# Patient Record
Sex: Female | Born: 1955 | Race: Black or African American | Hispanic: No | Marital: Single | Smoking: Never smoker
Health system: Southern US, Community
[De-identification: ages and names within clinical notes are randomized; demographics above are authoritative.]

## PROBLEM LIST (undated history)

## (undated) DIAGNOSIS — F32A Depression, unspecified: Secondary | ICD-10-CM

## (undated) DIAGNOSIS — R011 Cardiac murmur, unspecified: Secondary | ICD-10-CM

## (undated) DIAGNOSIS — F329 Major depressive disorder, single episode, unspecified: Secondary | ICD-10-CM

## (undated) DIAGNOSIS — G459 Transient cerebral ischemic attack, unspecified: Secondary | ICD-10-CM

## (undated) HISTORY — PX: TUBAL LIGATION: SHX77

## (undated) HISTORY — PX: CHOLECYSTECTOMY: SHX55

---

## 2016-09-26 ENCOUNTER — Emergency Department (HOSPITAL_COMMUNITY): Payer: Medicaid Other

## 2016-09-26 ENCOUNTER — Emergency Department (HOSPITAL_COMMUNITY)
Admission: EM | Admit: 2016-09-26 | Discharge: 2016-09-26 | Disposition: A | Discharge status: Home / Self Care | Payer: Medicaid Other | Attending: Emergency Medicine | Admitting: Emergency Medicine

## 2016-09-26 ENCOUNTER — Encounter (HOSPITAL_COMMUNITY): Payer: Self-pay | Admitting: Emergency Medicine

## 2016-09-26 DIAGNOSIS — Y929 Unspecified place or not applicable: Secondary | ICD-10-CM | POA: Insufficient documentation

## 2016-09-26 DIAGNOSIS — Y999 Unspecified external cause status: Secondary | ICD-10-CM | POA: Insufficient documentation

## 2016-09-26 DIAGNOSIS — W0110XA Fall on same level from slipping, tripping and stumbling with subsequent striking against unspecified object, initial encounter: Secondary | ICD-10-CM | POA: Diagnosis not present

## 2016-09-26 DIAGNOSIS — Y939 Activity, unspecified: Secondary | ICD-10-CM | POA: Insufficient documentation

## 2016-09-26 DIAGNOSIS — Z8673 Personal history of transient ischemic attack (TIA), and cerebral infarction without residual deficits: Secondary | ICD-10-CM | POA: Insufficient documentation

## 2016-09-26 DIAGNOSIS — S8992XA Unspecified injury of left lower leg, initial encounter: Secondary | ICD-10-CM | POA: Diagnosis present

## 2016-09-26 DIAGNOSIS — S8012XA Contusion of left lower leg, initial encounter: Secondary | ICD-10-CM | POA: Diagnosis not present

## 2016-09-26 HISTORY — DX: Cardiac murmur, unspecified: R01.1

## 2016-09-26 HISTORY — DX: Depression, unspecified: F32.A

## 2016-09-26 HISTORY — DX: Major depressive disorder, single episode, unspecified: F32.9

## 2016-09-26 HISTORY — DX: Transient cerebral ischemic attack, unspecified: G45.9

## 2016-09-26 MED ORDER — IBUPROFEN 600 MG PO TABS: 600.0000 mg | ORAL_TABLET | Freq: Four times a day (QID) | ORAL | 0 refills | Status: DC | PRN

## 2016-09-26 MED ORDER — IBUPROFEN 200 MG PO TABS
600.0000 mg | ORAL_TABLET | Freq: Once | ORAL | Status: AC
  Administered 2016-09-26: 600 mg via ORAL
  Filled 2016-09-26: qty 3

## 2016-09-26 NOTE — ED Provider Notes (Signed)
[facility]-EMERGENCY DEPT Provider Note   CSN: [id number] Arrival date & time: 09/26/16  1700  By signing my name below, I, Denise Parks, attest that this documentation has been prepared under the direction and in the presence of Denise Fordham, PA-C. Electronically Signed: Sonum Parks, Neurosurgeoncribe. 09/26/16. 6:29 PM.  History   Chief Complaint Chief Complaint  Patient presents with  . Fall    The history is provided by the patient. No language interpreter was used.     HPI Comments: Denise Parks is a 60 y.o. female who presents to the Emergency Department complaining of a fall that occurred earlier today. Patient was getting on the bus when she tripped and fell striking both shins on the steps. She reports pain to bilateral shins with associated pain to the left side. She has not taken any OTC medications for her symptoms. Denies hitting her head or LOC. Denies numbness or weakness. She has no other complaints at this time.   Past Medical History:  Diagnosis Date  . Depression   . Heart murmur   . TIA (transient ischemic attack)     There are no active problems to display for this patient.   Past Surgical History:  Procedure Laterality Date  . CHOLECYSTECTOMY    . TUBAL LIGATION      OB History    No data available       Home Medications    Prior to Admission medications   Not on File    Family History No family history on file.  Social History Social History  Substance Use Topics  . Smoking status: Never Smoker  . Smokeless tobacco: Never Used  . Alcohol use No     Allergies   Codeine; Penicillins; and Tetracyclines & related   Review of Systems Review of Systems  Musculoskeletal: Positive for arthralgias.  Neurological: Negative for weakness.  All other systems reviewed and are negative.    Physical Exam Updated Vital Signs BP 153/83 (BP Location: Left Arm)   Pulse 82   Temp 97.5 F (36.4 C) (Oral)   Resp 18   SpO2 99%   Physical Exam    Constitutional: She is oriented to person, place, and time. She appears well-developed and well-nourished.  HENT:  Head: Normocephalic and atraumatic.  Cardiovascular: Normal rate.   Pulmonary/Chest: Effort normal.  Musculoskeletal:  Bilateral anterior shins diffusely ttp with no edema or skin discoloration. Moves all extremities freely. Ambulatory with steady gait.  Neurological: She is alert and oriented to person, place, and time.  Skin: Skin is warm and dry.  Psychiatric: She has a normal mood and affect.  Nursing note and vitals reviewed.    ED Treatments / Results  DIAGNOSTIC STUDIES: Oxygen Saturation is 99% on RA, normal by my interpretation.    COORDINATION OF CARE: 6:40 PM Discussed treatment plan with pt at bedside and pt agreed to plan.   Labs (all labs ordered are listed, but only abnormal results are displayed) Labs Reviewed - No data to display  EKG  EKG Interpretation None       Radiology Dg Tibia/fibula Left  Result Date: 09/26/2016 CLINICAL DATA:  Fall on bus. Tripped striking both shins on steps. Bilateral lower leg pain. EXAM: LEFT TIBIA AND FIBULA - 2 VIEW COMPARISON:  None. FINDINGS: There is no evidence of fracture or other focal bone lesions. Knee and ankle alignment is maintained. Soft tissues are unremarkable. IMPRESSION: Negative radiographs of the left tibia/fibula. Electronically Signed   By: Denise OrleansMelanie  Parks M.D.   On: 09/26/2016 19:10   Dg Tibia/fibula Right  Result Date: 09/26/2016 CLINICAL DATA:  Fall on bus. Tripped striking both shins on steps. Bilateral lower leg pain. EXAM: RIGHT TIBIA AND FIBULA - 2 VIEW COMPARISON:  None. FINDINGS: There is no evidence of fracture or other focal bone lesions. Knee and ankle alignment is maintained. Soft tissues are unremarkable. IMPRESSION: Negative radiographs of the right lower leg. Electronically Signed   By: Denise OaksMelanie  Parks M.D.   On: 09/26/2016 19:11    Procedures Procedures (including critical  care time)  Medications Ordered in ED Medications - No data to display   Initial Impression / Assessment and Plan / ED Course  I have reviewed the triage vital signs and the nursing notes.  Pertinent labs & imaging results that were available during my care of the patient were reviewed by me and considered in my medical decision making (see chart for details).  Clinical Course     X-rays negative. Pt ambulatory with steady gait and otherwise neurovascularly intact. RICE therapy encouraged. Encouraged f/u with PCP. ER return precautions given.  Final Clinical Impressions(s) / ED Diagnoses   Final diagnoses:  Contusion of left lower leg, initial encounter    New Prescriptions New Prescriptions   IBUPROFEN (ADVIL,MOTRIN) 600 MG TABLET    Take 1 tablet (600 mg total) by mouth every 6 (six) hours as needed.   I personally performed the services described in this documentation, which was scribed in my presence. The recorded information has been reviewed and is accurate.    Denise CoriaSerena Y Adelin Ventrella, PA-C 09/26/16 Ernestina [id number]    Denise LefevreJulie Haviland, MD 09/26/16 2101

## 2016-09-26 NOTE — ED Triage Notes (Signed)
Per EMS pt was getting on bus, tripped, hit both shins on steps. No LOC, pt was ambulatory on scene. Redness to bilateral shins.

## 2016-09-26 NOTE — Discharge Instructions (Signed)
Take ibuprofen as needed for pain. Your x-rays were negative. You likely have some bruising and swelling. Ice your legs on and off to help with the pain. Keep your legs elevated when resting at home. Return to the ER for new or worsening symptoms.

## 2017-02-24 ENCOUNTER — Encounter (HOSPITAL_COMMUNITY): Payer: Self-pay

## 2017-02-24 ENCOUNTER — Emergency Department (HOSPITAL_COMMUNITY)
Admission: EM | Admit: 2017-02-24 | Discharge: 2017-02-24 | Disposition: A | Discharge status: Home / Self Care | Payer: Medicaid Other | Attending: Emergency Medicine | Admitting: Emergency Medicine

## 2017-02-24 ENCOUNTER — Emergency Department (HOSPITAL_COMMUNITY): Payer: Medicaid Other

## 2017-02-24 DIAGNOSIS — R51 Headache: Secondary | ICD-10-CM | POA: Diagnosis not present

## 2017-02-24 DIAGNOSIS — Z8673 Personal history of transient ischemic attack (TIA), and cerebral infarction without residual deficits: Secondary | ICD-10-CM | POA: Diagnosis not present

## 2017-02-24 DIAGNOSIS — Z7982 Long term (current) use of aspirin: Secondary | ICD-10-CM | POA: Insufficient documentation

## 2017-02-24 DIAGNOSIS — Z79899 Other long term (current) drug therapy: Secondary | ICD-10-CM | POA: Insufficient documentation

## 2017-02-24 DIAGNOSIS — R519 Headache, unspecified: Secondary | ICD-10-CM

## 2017-02-24 LAB — CBC WITH DIFFERENTIAL/PLATELET
Basophils Absolute: 0 10*3/uL (ref 0.0–0.1)
Basophils Relative: 0 %
Eosinophils Absolute: 0.1 10*3/uL (ref 0.0–0.7)
Eosinophils Relative: 2 %
HCT: 38.5 % (ref 36.0–46.0)
Hemoglobin: 12.5 g/dL (ref 12.0–15.0)
Lymphocytes Relative: 43 %
Lymphs Abs: 3.1 10*3/uL (ref 0.7–4.0)
MCH: 27.4 pg (ref 26.0–34.0)
MCHC: 32.5 g/dL (ref 30.0–36.0)
MCV: 84.2 fL (ref 78.0–100.0)
Monocytes Absolute: 0.4 10*3/uL (ref 0.1–1.0)
Monocytes Relative: 6 %
Neutro Abs: 3.7 10*3/uL (ref 1.7–7.7)
Neutrophils Relative %: 49 %
Platelets: ADEQUATE 10*3/uL (ref 150–400)
RBC: 4.57 MIL/uL (ref 3.87–5.11)
RDW: 13.7 % (ref 11.5–15.5)
WBC: 7.3 10*3/uL (ref 4.0–10.5)

## 2017-02-24 LAB — BASIC METABOLIC PANEL
Anion gap: 6 (ref 5–15)
BUN: 10 mg/dL (ref 6–20)
CO2: 26 mmol/L (ref 22–32)
Calcium: 8.8 mg/dL — ABNORMAL LOW (ref 8.9–10.3)
Chloride: 109 mmol/L (ref 101–111)
Creatinine, Ser: 0.96 mg/dL (ref 0.44–1.00)
GFR calc Af Amer: >60 mL/min (ref >60)
GFR calc non Af Amer: >60 mL/min (ref >60)
Glucose, Bld: 92 mg/dL (ref 65–99)
Potassium: 4.1 mmol/L (ref 3.5–5.1)
Sodium: 141 mmol/L (ref 135–145)

## 2017-02-24 MED ORDER — KETOROLAC TROMETHAMINE 15 MG/ML IJ SOLN
15.0000 mg | Freq: Once | INTRAMUSCULAR | Status: AC
  Administered 2017-02-24: 15 mg via INTRAVENOUS
  Filled 2017-02-24: qty 1

## 2017-02-24 MED ORDER — DIPHENHYDRAMINE HCL 50 MG/ML IJ SOLN
25.0000 mg | Freq: Once | INTRAMUSCULAR | Status: AC
  Administered 2017-02-24: 25 mg via INTRAVENOUS
  Filled 2017-02-24: qty 1

## 2017-02-24 MED ORDER — SODIUM CHLORIDE 0.9 % IV BOLUS (SEPSIS)
1000.0000 mL | Freq: Once | INTRAVENOUS | Status: AC
  Administered 2017-02-24: 1000 mL via INTRAVENOUS

## 2017-02-24 MED ORDER — PROCHLORPERAZINE EDISYLATE 5 MG/ML IJ SOLN
10.0000 mg | Freq: Once | INTRAMUSCULAR | Status: AC
  Administered 2017-02-24: 10 mg via INTRAVENOUS
  Filled 2017-02-24: qty 2

## 2017-02-24 NOTE — ED Notes (Signed)
ED Provider at bedside. 

## 2017-02-24 NOTE — ED Notes (Signed)
Bed: WA09 Expected date:  Expected time:  Means of arrival:  Comments: 61 yo HA

## 2017-02-24 NOTE — ED Triage Notes (Signed)
Per [facility]- Pt c/o of generalized headache that started at 0800. NEGATIVE FOR STROKE PER EMS. Neuro intact. Denies N/V/D and fever. Pt ambulatory with steady gait to ambulance. PEERL. Pt denies blurred vision. Pt however states "feels like my last stroke"

## 2017-02-24 NOTE — ED Provider Notes (Signed)
[facility]-EMERG[facility]Y DEPT Provider Note   CSN: [id number] Arrival date & time: 02/24/17  1342  By signing my name below, I, Doreatha Martin, attest that this documentation has been prepared under the direction and in the presence of Raeford Razor, MD. Electronically Signed: Doreatha Martin, ED Scribe. 02/24/17. 2:11 PM.     History   Chief Complaint Chief Complaint  Patient presents with  . Headache    HPI Denise Parks is a 61 y.o. female who presents to the Emergency Department complaining of moderate, right-sided HA that began this morning after waking with associated lightheadedness/dizziness. She reports her dizziness is worsened with standing and is accompanied by a room spinning sensation. Pt states she tried Metoprolol and 2 ASA with no relief of pain. She reports h/o similar HA with prior strokes. She denies nausea, visual disturbance, numbness, tingling, speech difficulty, neck pain, fever.   The history is provided by the patient. No language interpreter was used.    Past Medical History:  Diagnosis Date  . Depression   . Heart murmur   . TIA (transient ischemic attack)     There are no active problems to display for this patient.   Past Surgical History:  Procedure Laterality Date  . CHOLECYSTECTOMY    . TUBAL LIGATION      OB History    No data available       Home Medications    Prior to Admission medications   Medication Sig Start Date End Date Taking? Authorizing Provider  ibuprofen (ADVIL,MOTRIN) 600 MG tablet Take 1 tablet (600 mg total) by mouth every 6 (six) hours as needed. 09/26/16   Carlene Coria, PA-C    Family History No family history on file.  Social History Social History  Substance Use Topics  . Smoking status: Never Smoker  . Smokeless tobacco: Never Used  . Alcohol use No     Allergies   Codeine; Penicillins; and Tetracyclines & related   Review of Systems Review of Systems  Constitutional: Negative for fever.  Eyes: Negative for  visual disturbance.  Gastrointestinal: Negative for nausea.  Musculoskeletal: Negative for neck pain.  Neurological: Positive for dizziness, light-headedness and headaches. Negative for speech difficulty and numbness.  All other systems reviewed and are negative.    Physical Exam Updated Vital Signs BP 131/70 (BP Location: Right Arm)   Pulse (!) 55   Temp 97.8 F (36.6 C) (Oral)   Resp 18   Ht 5\' 2"  (1.575 m)   Wt 145 lb (65.8 kg)   SpO2 100%   BMI 26.52 kg/m   Physical Exam  Constitutional: She is oriented to person, place, and time. She appears well-developed and well-nourished. No distress.  HENT:  Head: Normocephalic and atraumatic.  Mouth/Throat: Oropharynx is clear and moist. No oropharyngeal exudate.  Eyes: Conjunctivae and EOM are normal. Pupils are equal, round, and reactive to light. Right eye exhibits no discharge. Left eye exhibits no discharge. No scleral icterus.  Neck: Normal range of motion. Neck supple. No JVD present. No thyromegaly present.  No nuchal rigidity   Cardiovascular: Normal rate, regular rhythm, normal heart sounds and intact distal pulses.  Exam reveals no gallop and no friction rub.   No murmur heard. Pulmonary/Chest: Effort normal and breath sounds normal. No respiratory distress. She has no wheezes. She has no rales.  Abdominal: Soft. Bowel sounds are normal. She exhibits no distension and no mass. There is no tenderness.  Musculoskeletal: Normal range of motion. She exhibits no edema  or tenderness.  Lymphadenopathy:    She has no cervical adenopathy.  Neurological: She is alert and oriented to person, place, and time. She displays normal reflexes. No cranial nerve deficit or sensory deficit. She exhibits normal muscle tone. Coordination normal.  Cranial nerves 2-12 grossly intact.   Skin: Skin is warm and dry. No rash noted. No erythema.  Psychiatric: She has a normal mood and affect. Her behavior is normal.  Nursing note and vitals  reviewed.    ED Treatments / Results   DIAGNOSTIC STUDIES: Oxygen Saturation is 100% on RA, normal by my interpretation.    COORDINATION OF CARE: 2:09 PM Discussed treatment plan with pt at bedside which includes pain control and pt agreed to plan.    Labs (all labs ordered are listed, but only abnormal results are displayed) Labs Reviewed  BASIC METABOLIC PANEL - Abnormal; Notable for the following:       Result Value   Calcium 8.8 (*)    All other components within normal limits  CBC WITH DIFFERENTIAL/PLATELET    EKG  EKG Interpretation None       Radiology No results found.   Ct Head Wo Contrast  Result Date: 02/24/2017 CLINICAL DATA:  61 year old female with acute right-sided headache today. Initial encounter. EXAM: CT HEAD WITHOUT CONTRAST TECHNIQUE: Contiguous axial images were obtained from the base of the skull through the vertex without intravenous contrast. COMPARISON:  None. FINDINGS: Brain: No evidence of acute infarction, hemorrhage, hydrocephalus, extra-axial collection or mass lesion/mass effect. Vascular: No hyperdense vessel or unexpected calcification. Skull: Normal. Negative for fracture or focal lesion. Sinuses/Orbits: No acute finding. Other: None. IMPRESSION: Unremarkable noncontrast head CT. Electronically Signed   By: Harmon PierJeffrey  Hu M.D.   On: 02/24/2017 16:04   Procedures Procedures (including critical care time)  Medications Ordered in ED Medications - No data to display   Initial Impression / Assessment and Plan / ED Course  I have reviewed the triage vital signs and the nursing notes.  Pertinent labs & imaging results that were available during my care of the patient were reviewed by me and considered in my medical decision making (see chart for details).     60yF with headache. Suspect primary HA. Consider emergent secondary causes such as bleed, infectious or mass but doubt. There is no history of trauma. Pt has no acute neurological  findings. Afebrile and neck supple. No use of blood thinning medication. Consider ocular etiology such as acute angle closure glaucoma but doubt. Pt denies acute change in visual acuity and eye exam unremarkable. Doubt temporal arteritis given age, no temporal tenderness and temporal artery pulsations palpable. Doubt CO poisoning. No contacts with similar symptoms. Doubt venous thrombosis. Doubt carotid or vertebral arteries dissection. Symptoms improved with meds. Feel that can be safely discharged, but strict return precautions discussed. Outpt fu.   Final Clinical Impressions(s) / ED Diagnoses   Final diagnoses:  Nonintractable headache, unspecified chronicity pattern, unspecified headache type    New Prescriptions New Prescriptions   No medications on file    I personally preformed the services scribed in my presence. The recorded information has been reviewed is accurate. Raeford RazorStephen Derward Marple, MD.    Raeford RazorKohut, Daanya Lanphier, MD 03/03/17 [phone]

## 2017-03-21 ENCOUNTER — Encounter (HOSPITAL_COMMUNITY): Payer: Self-pay

## 2017-03-21 ENCOUNTER — Emergency Department (HOSPITAL_COMMUNITY)
Admission: EM | Admit: 2017-03-21 | Discharge: 2017-03-21 | Disposition: A | Discharge status: Home / Self Care | Payer: Medicaid Other | Attending: Emergency Medicine | Admitting: Emergency Medicine

## 2017-03-21 DIAGNOSIS — S90862A Insect bite (nonvenomous), left foot, initial encounter: Secondary | ICD-10-CM | POA: Insufficient documentation

## 2017-03-21 DIAGNOSIS — Y929 Unspecified place or not applicable: Secondary | ICD-10-CM | POA: Insufficient documentation

## 2017-03-21 DIAGNOSIS — Y999 Unspecified external cause status: Secondary | ICD-10-CM | POA: Insufficient documentation

## 2017-03-21 DIAGNOSIS — Y939 Activity, unspecified: Secondary | ICD-10-CM | POA: Insufficient documentation

## 2017-03-21 DIAGNOSIS — W57XXXA Bitten or stung by nonvenomous insect and other nonvenomous arthropods, initial encounter: Secondary | ICD-10-CM | POA: Diagnosis not present

## 2017-03-21 DIAGNOSIS — Z7982 Long term (current) use of aspirin: Secondary | ICD-10-CM | POA: Insufficient documentation

## 2017-03-21 DIAGNOSIS — Z79899 Other long term (current) drug therapy: Secondary | ICD-10-CM | POA: Insufficient documentation

## 2017-03-21 NOTE — ED Notes (Signed)
Patient is alert and oriented x3.  She was given DC instructions and follow up visit instructions.  Patient gave verbal understanding. She was DC ambulatory under her own power to home.  V/S stable.  He was not showing any signs of distress on DC 

## 2017-03-21 NOTE — ED Provider Notes (Signed)
TIME SEEN: 3:52 AM   By signing my name below, I, Cynda Acres, attest that this documentation has been prepared under the direction and in the presence of Cipriana Biller, Layla Maw, DO. Electronically Signed: Cynda Acres, Scribe. 03/21/17. 3:54 AM.  CHIEF COMPLAINT: Tick bite   HPI:  Denise Parks is a 61 y.o. female with no pertinent past medical history, who presents to the Emergency Department for the removal of a tick, which she was bitten by 15 minutes prior to arrival. Patient believes she was bitten by a tick on the left foot a few minutes ago. Patient states she has a tick that is "scabbed over". Patient states the tick is embedded in her skin. Patient states she was unable to remove the tick. Patient reports associated swelling to the area. No modifying factors indicated. Patient denies any fever, chills, nausea, vomiting, numbness, or weakness.   ROS: See HPI Constitutional: no fever  Eyes: no drainage  ENT: no runny nose   Cardiovascular:  no chest pain  Resp: no SOB  GI: no vomiting GU: no dysuria Integumentary: no rash  Allergy: no hives  Musculoskeletal: no leg swelling  Neurological: no slurred speech ROS otherwise negative  PAST MEDICAL HISTORY/PAST SURGICAL HISTORY:  Past Medical History:  Diagnosis Date  . Depression   . Heart murmur   . TIA (transient ischemic attack)     MEDICATIONS:  Prior to Admission medications   Medication Sig Start Date End Date Taking? Authorizing Provider  aspirin 81 MG chewable tablet Chew 81 mg by mouth daily.    [provider]  lamoTRIgine (LAMICTAL) 100 MG tablet Take 100 mg by mouth daily.    [provider]  lamoTRIgine (LAMICTAL) 25 MG tablet Take 25 mg by mouth daily.    [provider]  metoprolol (LOPRESSOR) 50 MG tablet Take 50 mg by mouth 2 (two) times daily.    [provider]    ALLERGIES:  Allergies  Allergen Reactions  . Codeine   . Penicillins   . Tetracyclines & Related   .  Elavil [Amitriptyline Hcl] Rash    SOCIAL HISTORY:  Social History  Substance Use Topics  . Smoking status: Never Smoker  . Smokeless tobacco: Never Used  . Alcohol use No    FAMILY HISTORY: History reviewed. No pertinent family history.  EXAM: BP (!) 150/90   Pulse 100   Temp 97.7 F (36.5 C) (Oral)   Resp 18   Ht 5\' 4"  (1.626 m)   Wt 145 lb (65.8 kg)   SpO2 99%   BMI 24.89 kg/m  CONSTITUTIONAL: Alert and oriented and responds appropriately to questions. Well-appearing; well-nourished HEAD: Normocephalic EYES: Conjunctivae clear, pupils appear equal, EOMI ENT: normal nose; moist mucous membranes NECK: Supple, no meningismus, no nuchal rigidity, no LAD  CARD: RRR; S1 and S2 appreciated; no murmurs, no clicks, no rubs, no gallops RESP: Normal chest excursion without splinting or tachypnea; breath sounds clear and equal bilaterally; no wheezes, no rhonchi, no rales, no hypoxia or respiratory distress, speaking full sentences ABD/GI: Normal bowel sounds; non-distended; soft, non-tender, no rebound, no guarding, no peritoneal signs, no hepatosplenomegaly BACK:  The back appears normal and is non-tender to palpation, there is no CVA tenderness EXT: Normal ROM in all joints; non-tender to palpation; no edema; normal capillary refill; no cyanosis, no calf tenderness or swelling, 2+ left DP pulse. No bony injury or bony tenderness. No joint effusion. Compartments in the legs are soft.    SKIN: Normal  color for age and race; warm; no rash; Patient has a very superficial abrasion that is 2 mm to the dorsal left foot with no sign of surrounding erythema, warmth, induration, fluctuance. There is no blister or desquamation. There is no foreign body appreciated or insect pieces/parts. There is no bull's-eye ring. NEURO: Moves all extremities equally PSYCH: The patient's mood and manner are appropriate. Grooming and personal hygiene are appropriate.  MEDICAL DECISION MAKING: Patient here with  concerns for a tick bite to the left foot. She states that she saw a insect bite her and she felt like it went into her skin. She is convinced that the insect is underneath her skin. I have numbed this area with 1% lidocaine and cleaned it and have examined it closely and there is no sign of any foreign body including pieces of a tick or other insect. Have discussed this with her and patient becomes very upset and begins yelling at me and other staff members. She states that she knows that there is a insect that has crawled under her skin. I have attempted to explain to her that ticks do not embed themselves underneath the skin but sometimes ticks can have small pieces that do in bed in the skin when we remove them. Discussed with her that there is not any foreign body. Patient does not seem to accept this explanation and continues to yell at this provider. I do not see any sign of any life-threatening illness or injury present. There is no sign of any superimposed infection. No other rash or sign of any insect bites. I feel she has been medically screened and can be discharged home.  No signs of superimposed infection. No fever. I do not feel she needs antibiotics at this time.  At this time, I do not feel there is any life-threatening condition present. I have reviewed and discussed all results (EKG, imaging, lab, urine as appropriate) and exam findings with patient/family. I have reviewed nursing notes and appropriate previous records.  I feel the patient is safe to be discharged home without further emergent workup and can continue workup as an outpatient as needed. Discussed usual and customary return precautions. Patient/family verbalize understanding and are comfortable with this plan.  Outpatient follow-up has been provided if needed. All questions have been answered.   I personally performed the services described in this documentation, which was scribed in my presence. The recorded information has  been reviewed and is accurate.     Cathie Bonnell, Layla MawKristen N, DO 03/21/17 [phone]

## 2017-03-21 NOTE — ED Triage Notes (Signed)
States thinks she was bitten by tick about 15 min on top of left foot/ankle

## 2017-03-21 NOTE — Discharge Instructions (Signed)
You were seen in the emergency department for an insect bite. There is no sign of any residual insect part and tics do not burrow under your skin. At this time you do not need to be on antibiotics. You may clean this wound with warm soap and water and apply Neosporin twice a day.

## 2017-03-21 NOTE — ED Notes (Signed)
Patient is arguing with the doctor about the status of a tick that the patient thinks is embedded under her skin.  MD requested 1% lidocaine to remove the scab from the skin to prove to the patient that no insect or foreign object was under her skin.  After scab removal there was completed nothing was found and the area was bandaged

## 2017-10-26 IMAGING — CR DG TIBIA/FIBULA 2V*R*
2 series · 2 of 2 positions shown · non-contrast
Comparison: None.

CLINICAL DATA: Fall on bus. Tripped striking both shins on steps.
Bilateral lower leg pain.

EXAM:
RIGHT TIBIA AND FIBULA - 2 VIEW

[x tib-fib ap right]
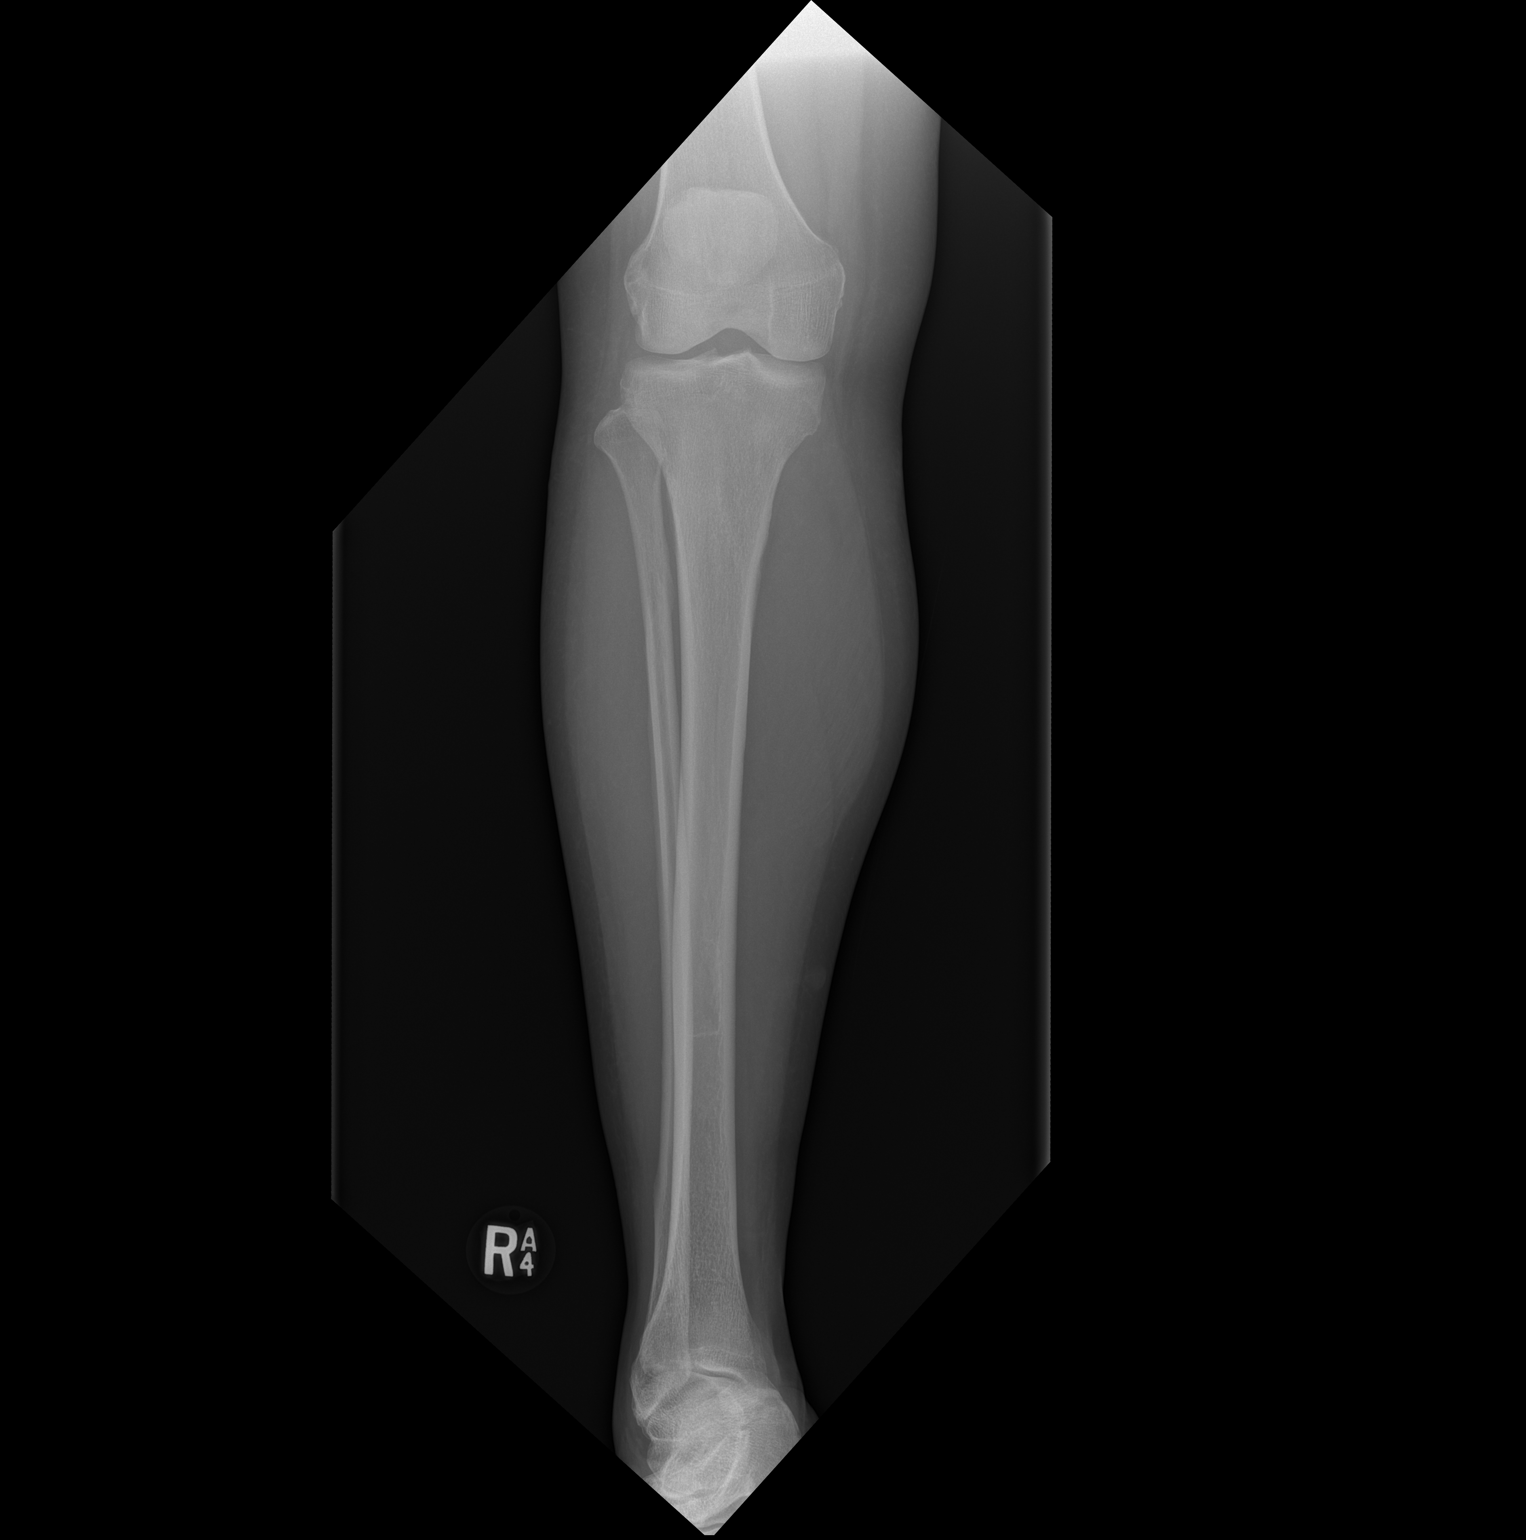

[x tib-fib lat right]
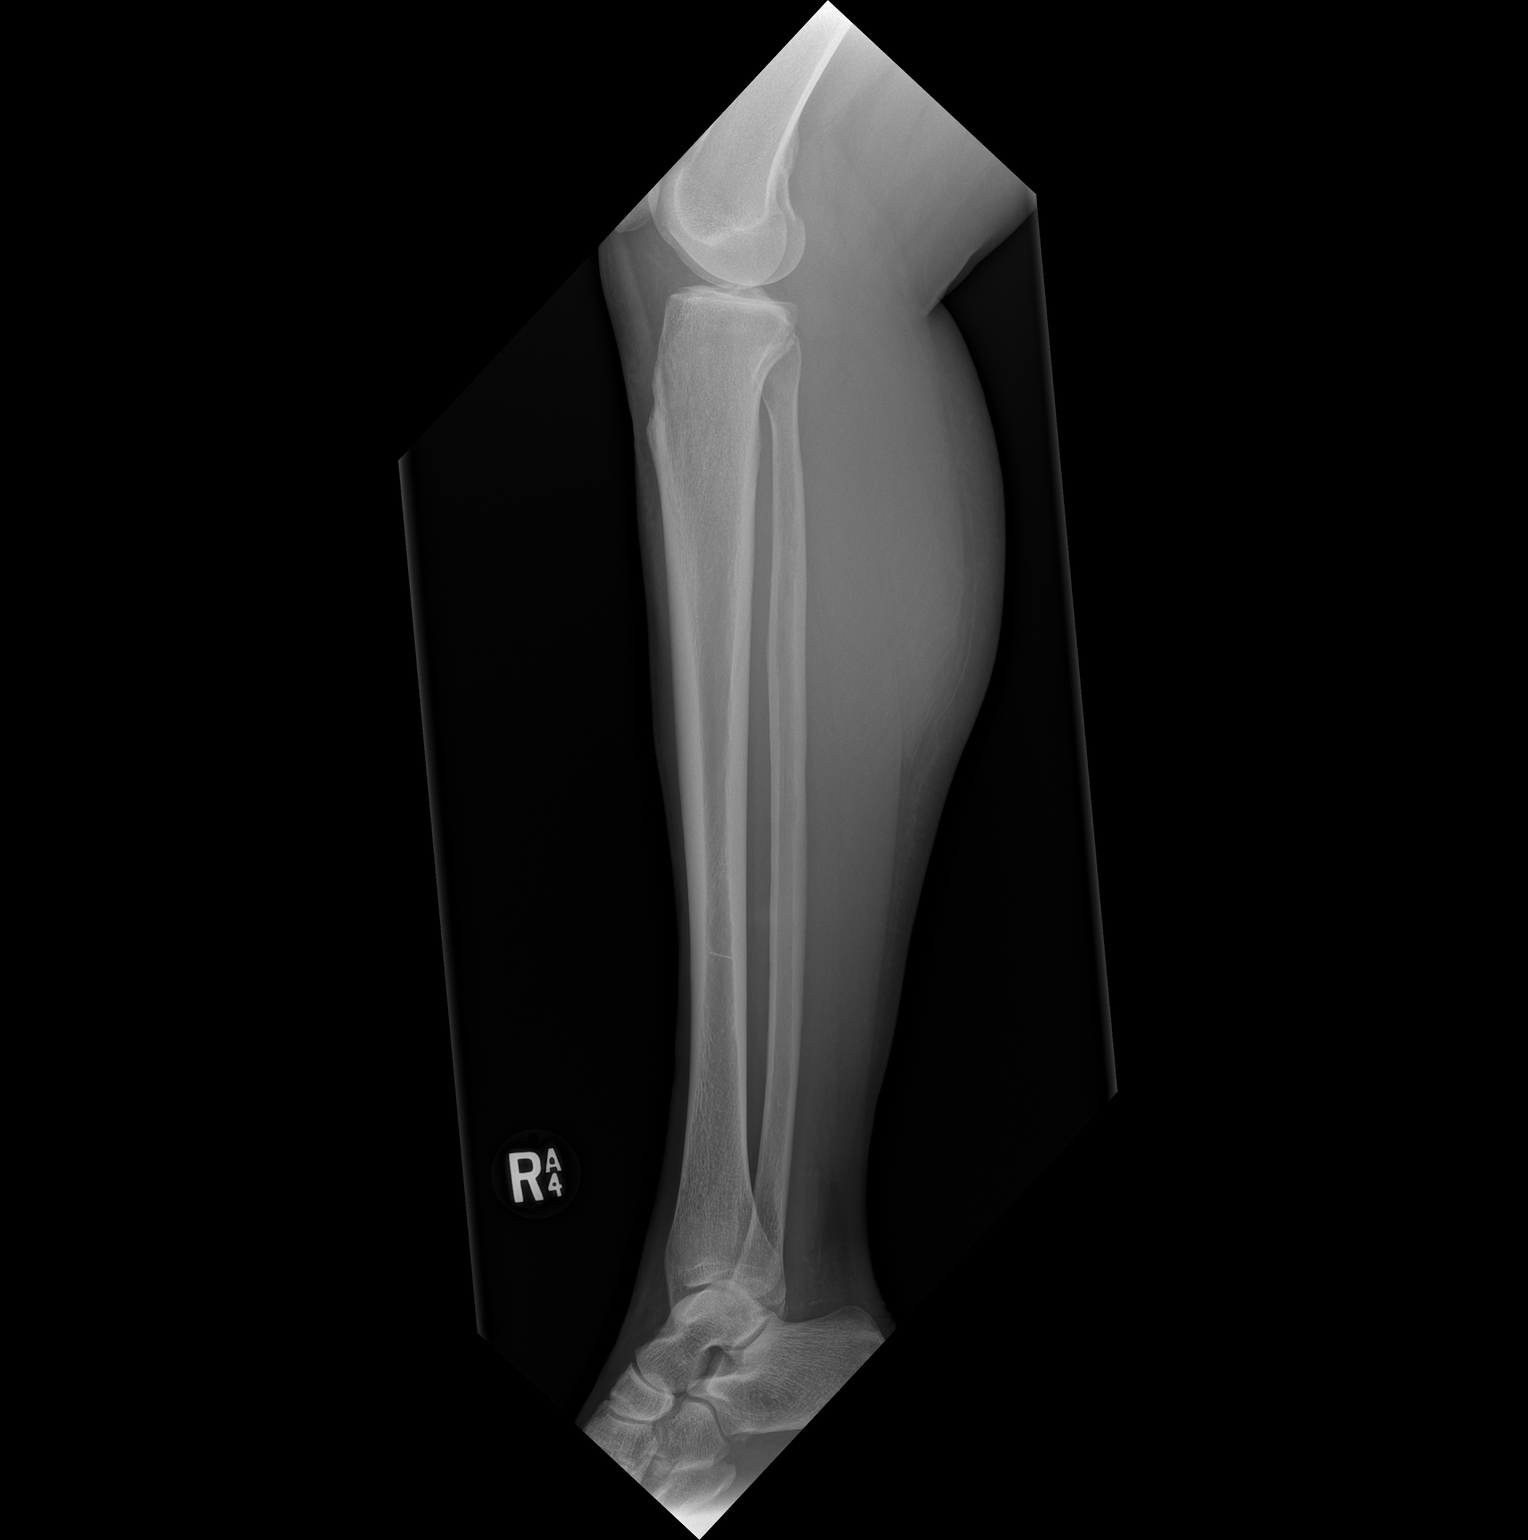

[2 of 2 positions shown; findings below may reference images not displayed]

FINDINGS: There is no evidence of fracture or other focal bone lesions. Knee
and ankle alignment is maintained. Soft tissues are unremarkable.
IMPRESSION: Negative radiographs of the right lower leg.

## 2018-03-26 IMAGING — CT CT HEAD W/O CM
3 of 4 series · 16 of 47 positions shown, 19 images · non-contrast
Comparison: None.

CLINICAL DATA: 60-year-old female with acute right-sided headache
today. Initial encounter.

EXAM:
CT HEAD WITHOUT CONTRAST
TECHNIQUE: Contiguous axial images were obtained from the base of the skull
through the vertex without intravenous contrast.

[Series 2: head w/o · axial · non-contrast · 0.37mm/px · z∈[-116,+4]mm · 10 of 29 slices shown, 13 images]
[im 3/29  brain]
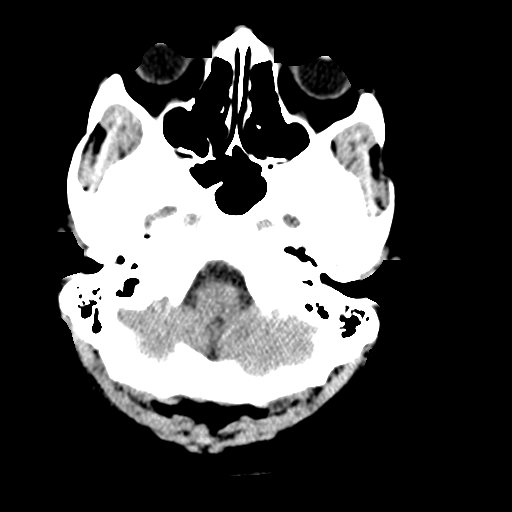
[im 3/29  bone]
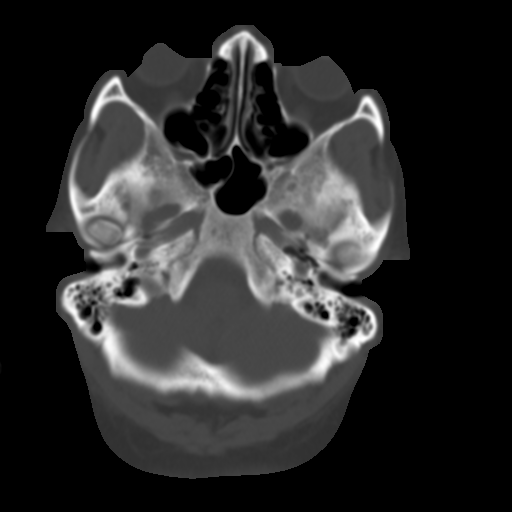
[im 5/29  brain]
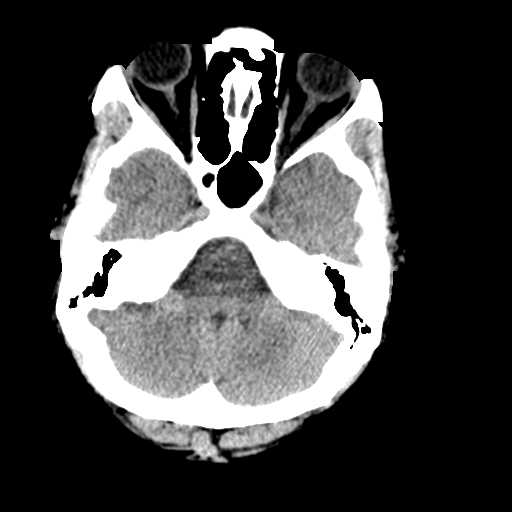
[im 9/29  brain]
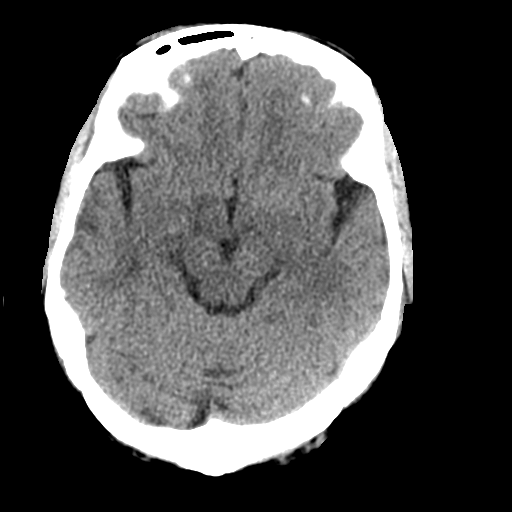
[im 11/29  brain]
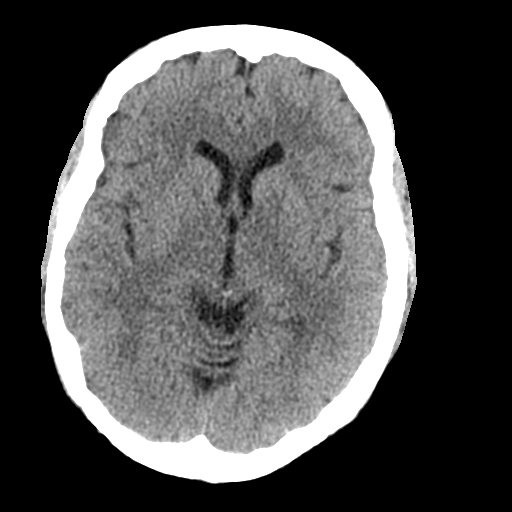
[im 13/29  brain]
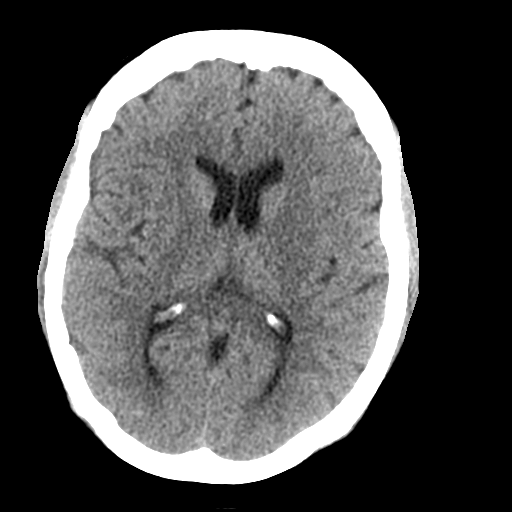
[im 13/29  bone]
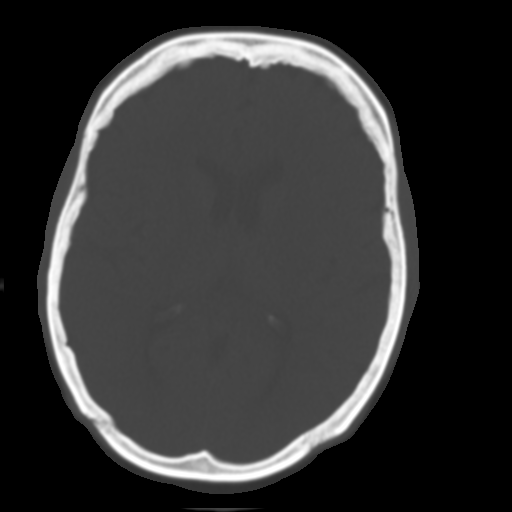
[im 17/29  brain]
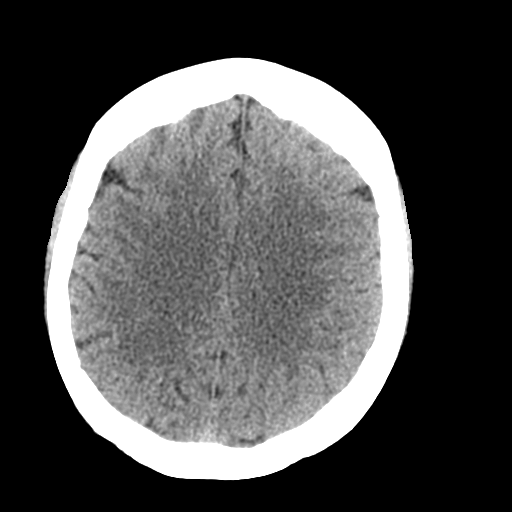
[im 19/29  brain]
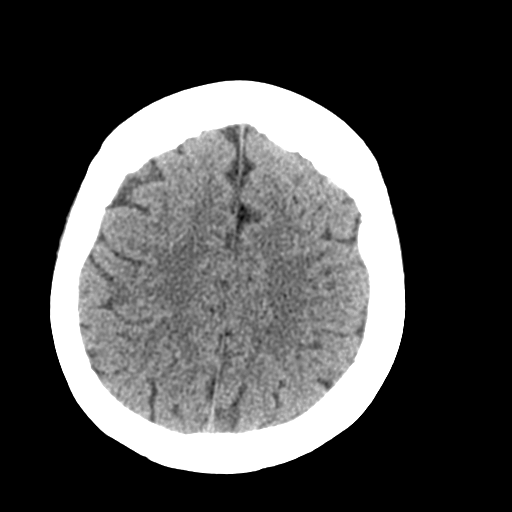
[im 21/29  brain]
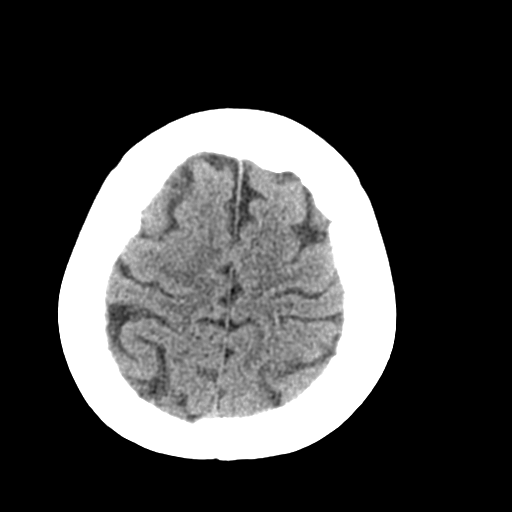
[im 25/29  brain]
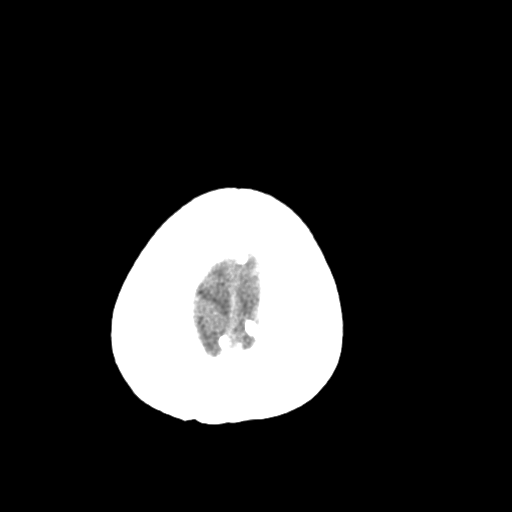
[im 25/29  bone]
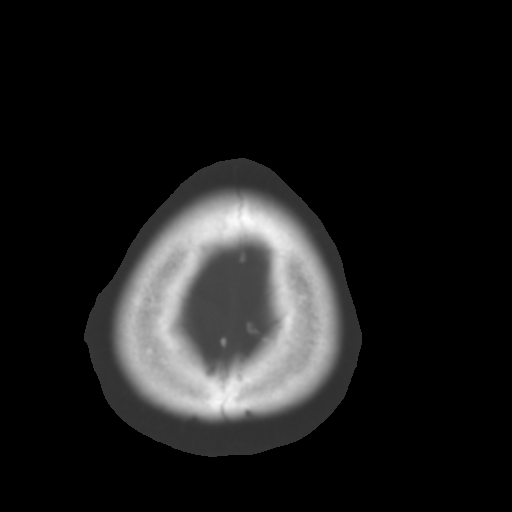
[im 27/29  brain]
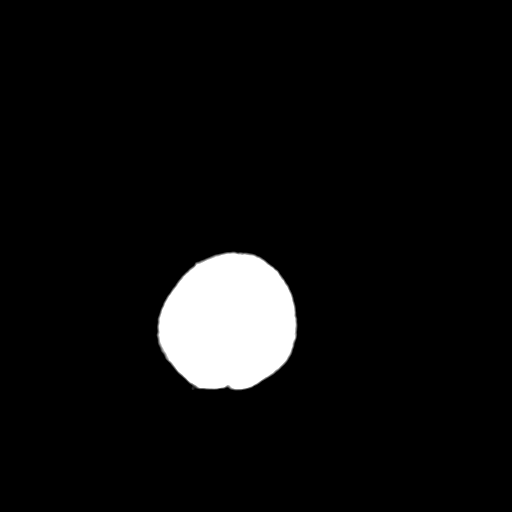

[Series 5: coronal · coronal · 0.28mm/px · 3 of 58 slices shown]
[im 20/58  brain]
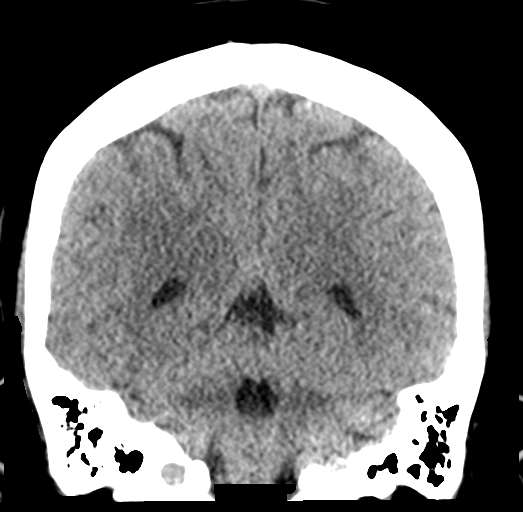
[im 26/58  brain]
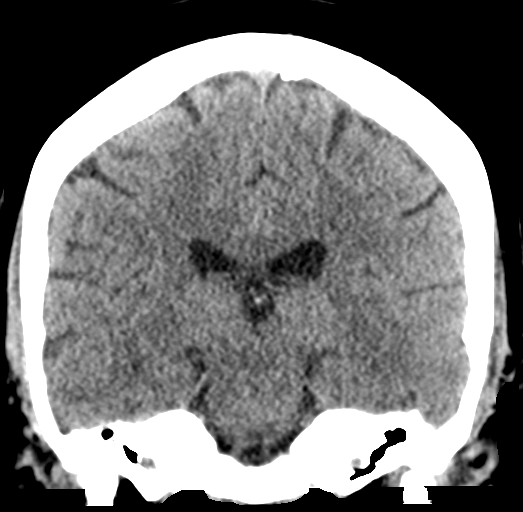
[im 32/58  brain]
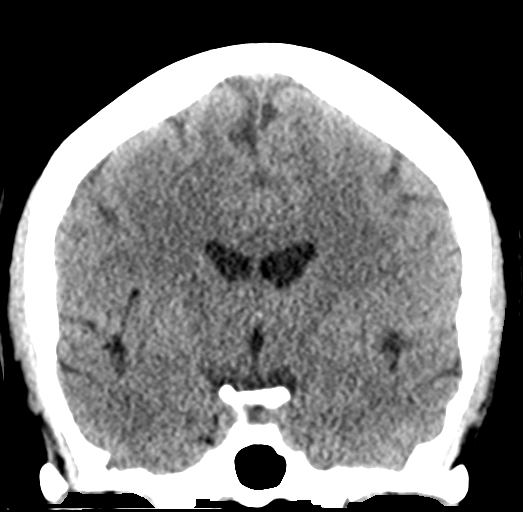

[Series 6: sagittal · sagittal · 0.28mm/px · 3 of 50 slices shown]
[im 17/50  brain]
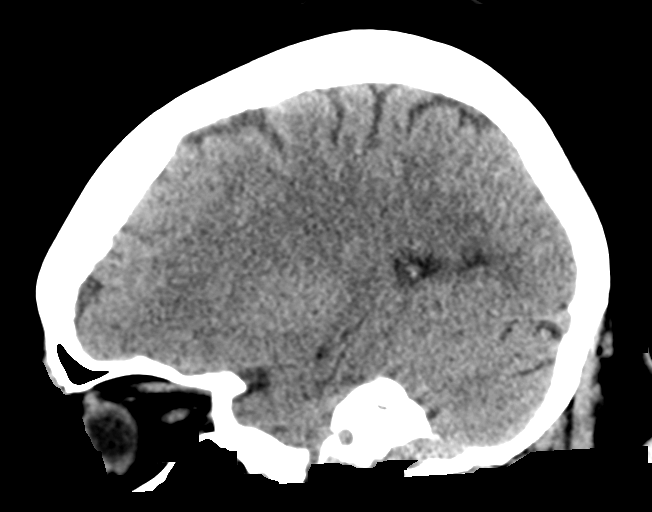
[im 25/50  brain]
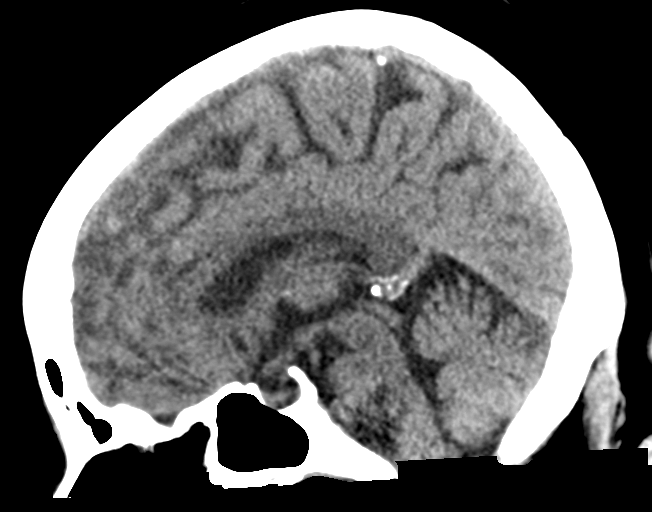
[im 33/50  brain]
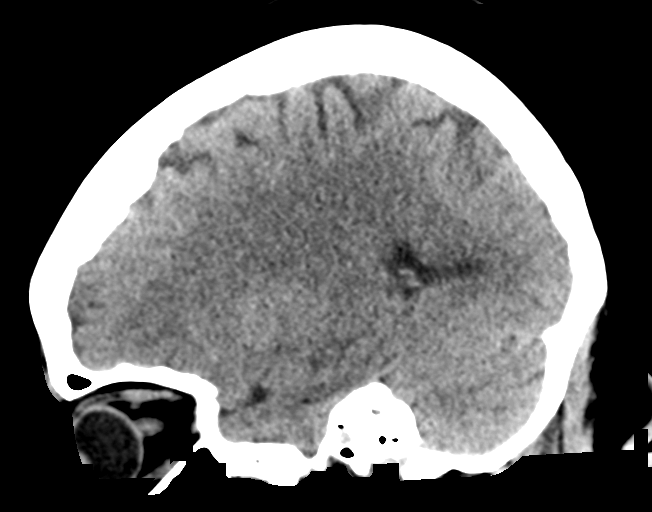

[16 of 47 positions shown; findings below may reference images not displayed]

FINDINGS: Brain: No evidence of acute infarction, hemorrhage, hydrocephalus,
extra-axial collection or mass lesion/mass effect.

Vascular: No hyperdense vessel or unexpected calcification.

Skull: Normal. Negative for fracture or focal lesion.

Sinuses/Orbits: No acute finding.

Other: None.
IMPRESSION: Unremarkable noncontrast head CT.
